# Patient Record
Sex: Male | Born: 1995 | Race: Black or African American | Hispanic: No | Marital: Single | Smoking: Never smoker
Health system: Southern US, Community
[De-identification: ages and names within clinical notes are randomized; demographics above are authoritative.]

## PROBLEM LIST (undated history)

## (undated) DIAGNOSIS — E119 Type 2 diabetes mellitus without complications: Secondary | ICD-10-CM

## (undated) DIAGNOSIS — J45909 Unspecified asthma, uncomplicated: Secondary | ICD-10-CM

## (undated) HISTORY — PX: ABDOMINAL SURGERY: SHX537

## (undated) HISTORY — PX: CENTRAL VENOUS CATHETER INSERTION: SHX401

---
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
## 2017-06-01 ENCOUNTER — Encounter (HOSPITAL_COMMUNITY): Payer: Self-pay | Admitting: Emergency Medicine

## 2017-06-01 ENCOUNTER — Emergency Department (HOSPITAL_COMMUNITY)
Admission: EM | Admit: 2017-06-01 | Discharge: 2017-06-02 | Disposition: A | Discharge status: Home / Self Care | Payer: Medicaid Other | Attending: Emergency Medicine | Admitting: Emergency Medicine

## 2017-06-01 DIAGNOSIS — R51 Headache: Secondary | ICD-10-CM | POA: Diagnosis not present

## 2017-06-01 DIAGNOSIS — R519 Headache, unspecified: Secondary | ICD-10-CM

## 2017-06-01 DIAGNOSIS — J45909 Unspecified asthma, uncomplicated: Secondary | ICD-10-CM | POA: Insufficient documentation

## 2017-06-01 MED ORDER — METOCLOPRAMIDE HCL 5 MG/ML IJ SOLN
10.0000 mg | INTRAMUSCULAR | Status: AC
  Administered 2017-06-01: 10 mg via INTRAVENOUS
  Filled 2017-06-01: qty 2

## 2017-06-01 MED ORDER — KETOROLAC TROMETHAMINE 30 MG/ML IJ SOLN
30.0000 mg | Freq: Once | INTRAMUSCULAR | Status: AC
  Administered 2017-06-01: 30 mg via INTRAVENOUS
  Filled 2017-06-01: qty 1

## 2017-06-01 MED ORDER — IBUPROFEN 200 MG PO TABS
400.0000 mg | ORAL_TABLET | Freq: Once | ORAL | Status: AC | PRN
  Administered 2017-06-01: 400 mg via ORAL
  Filled 2017-06-01: qty 2

## 2017-06-01 MED ORDER — DIPHENHYDRAMINE HCL 50 MG/ML IJ SOLN
25.0000 mg | Freq: Once | INTRAMUSCULAR | Status: AC
  Administered 2017-06-01: 25 mg via INTRAVENOUS
  Filled 2017-06-01: qty 1

## 2017-06-01 MED ORDER — ONDANSETRON 8 MG PO TBDP
8.0000 mg | ORAL_TABLET | Freq: Once | ORAL | Status: AC
  Administered 2017-06-01: 8 mg via ORAL
  Filled 2017-06-01: qty 1

## 2017-06-01 NOTE — ED Provider Notes (Signed)
[facility]-EMERGENCY DEPT Provider Note   CSN: [id number] Arrival date & time: 06/01/17  1955     History   Chief Complaint Chief Complaint  Patient presents with  . Migraine    HPI Henry Colon is a 21 y.o. male.  21 year old male with a history of asthma presents to the emergency department for a headache. He reports a constant pressure-like pain in his central forehead which began earlier today. Symptoms associated with photophobia as well as intermittent blurry vision in his left eye. He has also had some nausea and vomiting. Patient further complains of lightheadedness. No medications taken prior to arrival for symptoms. Mother reports a history of headaches intermittently. Patient states that his symptoms today feel similar. He has had no fever, extremity numbness, extremity weakness, syncope, diarrhea. No recent head injury or trauma.   The history is provided by the patient. No language interpreter was used.  Migraine     Past Medical History:  Diagnosis Date  . Asthma   . Premature baby     There are no active problems to display for this patient.   Past Surgical History:  Procedure Laterality Date  . ABDOMINAL SURGERY    . CENTRAL VENOUS CATHETER INSERTION         Home Medications    Prior to Admission medications   Medication Sig Start Date End Date Taking? Authorizing Provider  Multiple Vitamin (MULTIVITAMIN WITH MINERALS) TABS tablet Take 1 tablet by mouth daily.   Yes [provider]  albuterol (PROVENTIL HFA;VENTOLIN HFA) 108 (90 Base) MCG/ACT inhaler Inhale 2 puffs into the lungs every 4 (four) hours as needed for wheezing or shortness of breath. Patient not taking: Reported on 06/01/2017 01/09/16   Pisciotta, Joni Reining, PA-C  butalbital-acetaminophen-caffeine (FIORICET, ESGIC) [phone] MG tablet Take 1-2 tablets by mouth every 8 (eight) hours as needed for headache. 06/02/17 06/02/18  Antony Madura, PA-C  ibuprofen (ADVIL,MOTRIN) 600 MG tablet Take 1  tablet (600 mg total) by mouth every 6 (six) hours as needed for mild pain or moderate pain. Patient not taking: Reported on 06/01/2017 11/23/15   Antony Madura, PA-C    Family History History reviewed. No pertinent family history.  Social History Social History  Substance Use Topics  . Smoking status: Never Smoker  . Smokeless tobacco: Never Used  . Alcohol use No     Allergies   Patient has no known allergies.   Review of Systems Review of Systems Ten systems reviewed and are negative for acute change, except as noted in the HPI.    Physical Exam Updated Vital Signs BP (!) 98/58 (BP Location: Left Arm)   Pulse (!) 105   Temp 98.7 F (37.1 C) (Oral)   Resp 18   Ht 6' (1.829 m)   Wt 94.3 kg (208 lb)   SpO2 100%   BMI 28.21 kg/m   Physical Exam  Constitutional: He is oriented to person, place, and time. He appears well-developed and well-nourished. No distress.  Nontoxic and in NAD  HENT:  Head: Normocephalic and atraumatic.  Mouth/Throat: Oropharynx is clear and moist.  Eyes: Conjunctivae and EOM are normal. Pupils are equal, round, and reactive to light. No scleral icterus.  Neck: Normal range of motion.  No nuchal rigidity or meningismus  Cardiovascular: Normal rate, regular rhythm and intact distal pulses.   Pulmonary/Chest: Effort normal. No respiratory distress. He has no wheezes.  Respirations even and unlabored  Musculoskeletal: Normal range of motion.  Neurological: He is alert and oriented  to person, place, and time. No cranial nerve deficit. He exhibits normal muscle tone. Coordination normal.  GCS 15. Speech is goal oriented. No cranial nerve deficits appreciated; symmetric eyebrow raise, no facial drooping, tongue midline. Patient has equal grip strength bilaterally with 5/5 strength against resistance in all major muscle groups bilaterally. Sensation to light touch intact. Patient moves extremities without ataxia.  Skin: Skin is warm and dry. No rash  noted. He is not diaphoretic. No erythema. No pallor.  Psychiatric: He has a normal mood and affect. His behavior is normal.  Nursing note and vitals reviewed.    ED Treatments / Results  Labs (all labs ordered are listed, but only abnormal results are displayed) Labs Reviewed - No data to display  EKG  EKG Interpretation None       Radiology No results found.  Procedures Procedures (including critical care time)  Medications Ordered in ED Medications  ondansetron (ZOFRAN-ODT) disintegrating tablet 8 mg (8 mg Oral Given 06/01/17 2017)  ibuprofen (ADVIL,MOTRIN) tablet 400 mg (400 mg Oral Given 06/01/17 2020)  ketorolac (TORADOL) 30 MG/ML injection 30 mg (30 mg Intravenous Given 06/01/17 2310)  metoCLOPramide (REGLAN) injection 10 mg (10 mg Intravenous Given 06/01/17 2310)  diphenhydrAMINE (BENADRYL) injection 25 mg (25 mg Intravenous Given 06/01/17 2311)     Initial Impression / Assessment and Plan / ED Course  I have reviewed the triage vital signs and the nursing notes.  Pertinent labs & imaging results that were available during my care of the patient were reviewed by me and considered in my medical decision making (see chart for details).     21 year old male presents to the emergency department for evaluation of a frontal headache. He describes the pain as a pressure with associated photophobia, nausea, and vomiting. He reports a history of similar headaches in the past. No medications taken prior to arrival. No recent head injury or trauma.  Patient without fever, nuchal rigidity, or meningismus. Doubt meningitis. Further doubt hemorrhage, hydrocephalus, or mass effect given intermittent nature and chronicity of symptoms. Patient has had resolution of his pain with supportive management. Suspect tension headache. Will manage supportively on an outpatient basis. Patient expresses comfort with discharge. Return precautions discussed and provided. Patient discharged in stable  condition with no unaddressed concerns.   Final Clinical Impressions(s) / ED Diagnoses   Final diagnoses:  Bad headache    New Prescriptions New Prescriptions   BUTALBITAL-ACETAMINOPHEN-CAFFEINE (FIORICET, ESGIC) 50-325-40 MG TABLET    Take 1-2 tablets by mouth every 8 (eight) hours as needed for headache.     Antony MaduraHumes, Bright Spielmann, PA-C 06/02/17 0041    Pricilla LovelessGoldston, Scott, MD 06/07/17 [phone]

## 2017-06-01 NOTE — ED Notes (Signed)
Pt is alert and oriented x 4 and is verbally responsive, and is accompanied with mother and brothers. Pt is / c/o Migraine, reports pain is 4/10 with ome associated vomiting. Pt does report some lightheaded. No nausea at this time, and denies blurred vision, diarrhea, or pain or discomfort other parts of body.

## 2017-06-01 NOTE — ED Triage Notes (Signed)
[place] EMS was called due to patient complaining of headache. Patient vomited as he entered the ED.

## 2017-06-02 MED ORDER — BUTALBITAL-APAP-CAFFEINE 50-325-40 MG PO TABS: 1.0000 | ORAL_TABLET | Freq: Three times a day (TID) | ORAL | 0 refills | Status: AC | PRN

## 2017-09-23 ENCOUNTER — Encounter (HOSPITAL_COMMUNITY): Payer: Self-pay | Admitting: *Deleted

## 2017-09-23 ENCOUNTER — Emergency Department (HOSPITAL_COMMUNITY)
Admission: EM | Admit: 2017-09-23 | Discharge: 2017-09-23 | Discharge status: Against Medical Advice | Payer: Medicaid Other | Attending: Emergency Medicine | Admitting: Emergency Medicine

## 2017-09-23 DIAGNOSIS — R51 Headache: Secondary | ICD-10-CM | POA: Diagnosis present

## 2017-09-23 DIAGNOSIS — Z5321 Procedure and treatment not carried out due to patient leaving prior to being seen by health care provider: Secondary | ICD-10-CM | POA: Insufficient documentation

## 2017-09-23 NOTE — ED Notes (Addendum)
Pt called from lobby with no response. 

## 2017-09-23 NOTE — ED Notes (Addendum)
Pt called from the lobby with no response x2 

## 2017-09-23 NOTE — ED Triage Notes (Signed)
Started to feel light headed and developed headache last night, has not ate today but has drank some water.

## 2018-03-31 ENCOUNTER — Encounter (HOSPITAL_COMMUNITY): Payer: Self-pay | Admitting: Emergency Medicine

## 2018-03-31 ENCOUNTER — Emergency Department (HOSPITAL_COMMUNITY)
Admission: EM | Admit: 2018-03-31 | Discharge: 2018-03-31 | Disposition: A | Discharge status: Home / Self Care | Payer: Self-pay | Attending: Emergency Medicine | Admitting: Emergency Medicine

## 2018-03-31 ENCOUNTER — Emergency Department (HOSPITAL_COMMUNITY): Payer: Self-pay

## 2018-03-31 DIAGNOSIS — J069 Acute upper respiratory infection, unspecified: Secondary | ICD-10-CM | POA: Insufficient documentation

## 2018-03-31 DIAGNOSIS — Z79899 Other long term (current) drug therapy: Secondary | ICD-10-CM | POA: Insufficient documentation

## 2018-03-31 DIAGNOSIS — R05 Cough: Secondary | ICD-10-CM | POA: Insufficient documentation

## 2018-03-31 DIAGNOSIS — R059 Cough, unspecified: Secondary | ICD-10-CM

## 2018-03-31 DIAGNOSIS — J45909 Unspecified asthma, uncomplicated: Secondary | ICD-10-CM | POA: Insufficient documentation

## 2018-03-31 NOTE — ED Triage Notes (Signed)
Patient here from home with complaints of productive cough with yellow sputum x2 days. Denies fever.

## 2018-03-31 NOTE — Discharge Instructions (Signed)
Continue to stay well-hydrated. Gargle warm salt water and spit it out and use chloraseptic spray as needed for sore throat. Continue to alternate between Tylenol and Ibuprofen for pain or fever. Use Mucinex for cough suppression/expectoration of mucus. Use netipot and flonase to help with nasal congestion. May consider over-the-counter Benadryl or other antihistamine to decrease secretions and for help with your symptoms. Follow up with your primary care doctor in 5-7 days for recheck of ongoing symptoms. Return to emergency department for emergent changing or worsening of symptoms.

## 2018-03-31 NOTE — ED Provider Notes (Signed)
[facility]-EMERGENCY DEPT Provider Note   CSN: [id number] Arrival date & time: 03/31/18  1333     History   Chief Complaint Chief Complaint  Patient presents with  . Cough    HPI Henry Colon is a 22 y.o. male with a PMHx of asthma, who presents to the ED with complaints of cough with yellow sputum production and hoarse voice with rhinorrhea x 2 days.  He has tried over-the-counter cold medicines with no relief of his symptoms, no known aggravating factors.  He is a non-smoker, no known sick contacts.  He denies any fevers, chills, ear pain or drainage, sore throat, chest pain, shortness of breath, wheezing, abd pain, N/V/D/C, hematuria, dysuria, myalgias, arthralgias, numbness, tingling, focal weakness, or any other complaints at this time.   The history is provided by the patient and medical records. No language interpreter was used.  Cough  This is a new problem. The current episode started 2 days ago. The problem occurs constantly. The problem has not changed since onset.The cough is productive of sputum. There has been no fever. Associated symptoms include rhinorrhea. Pertinent negatives include no chest pain, no chills, no ear pain, no sore throat, no myalgias, no shortness of breath and no wheezing. He has tried cough syrup for the symptoms. The treatment provided no relief. He is not a smoker. His past medical history is significant for asthma.    Past Medical History:  Diagnosis Date  . Asthma   . Premature baby     There are no active problems to display for this patient.   Past Surgical History:  Procedure Laterality Date  . ABDOMINAL SURGERY    . CENTRAL VENOUS CATHETER INSERTION          Home Medications    Prior to Admission medications   Medication Sig Start Date End Date Taking? Authorizing Provider  albuterol (PROVENTIL HFA;VENTOLIN HFA) 108 (90 Base) MCG/ACT inhaler Inhale 2 puffs into the lungs every 4 (four) hours as needed  for wheezing or shortness of breath. Patient not taking: Reported on 06/01/2017 01/09/16   Pisciotta, Joni Reining, PA-C  butalbital-acetaminophen-caffeine (FIORICET, ESGIC) [phone] MG tablet Take 1-2 tablets by mouth every 8 (eight) hours as needed for headache. 06/02/17 06/02/18  Antony Madura, PA-C  ibuprofen (ADVIL,MOTRIN) 600 MG tablet Take 1 tablet (600 mg total) by mouth every 6 (six) hours as needed for mild pain or moderate pain. Patient not taking: Reported on 06/01/2017 11/23/15   Antony Madura, PA-C  Multiple Vitamin (MULTIVITAMIN WITH MINERALS) TABS tablet Take 1 tablet by mouth daily.    [provider]    Family History No family history on file.  Social History Social History   Tobacco Use  . Smoking status: Never Smoker  . Smokeless tobacco: Never Used  Substance Use Topics  . Alcohol use: No  . Drug use: No     Allergies   Patient has no known allergies.   Review of Systems Review of Systems  Constitutional: Negative for chills and fever.  HENT: Positive for rhinorrhea and voice change. Negative for ear discharge, ear pain and sore throat.   Respiratory: Positive for cough. Negative for shortness of breath and wheezing.   Cardiovascular: Negative for chest pain.  Gastrointestinal: Negative for abdominal pain, constipation, diarrhea, nausea and vomiting.  Genitourinary: Negative for dysuria and hematuria.  Musculoskeletal: Negative for arthralgias and myalgias.  Skin: Negative for color change.  Allergic/Immunologic: Negative for immunocompromised state.  Neurological: Negative for weakness and  numbness.  Psychiatric/Behavioral: Negative for confusion.   All other systems reviewed and are negative for acute change except as noted in the HPI.    Physical Exam Updated Vital Signs BP 137/77 (BP Location: Right Arm)   Pulse 80   Temp 98.5 F (36.9 C) (Oral)   Resp 18   SpO2 99%   Physical Exam  Constitutional: He is oriented to person, place, and time.  Vital signs are normal. He appears well-developed and well-nourished.  Non-toxic appearance. No distress.  Afebrile, nontoxic, NAD  HENT:  Head: Normocephalic and atraumatic.  Nose: Mucosal edema present.  Mouth/Throat: Uvula is midline, oropharynx is clear and moist and mucous membranes are normal. No trismus in the jaw. No uvula swelling. Tonsils are 0 on the right. Tonsils are 0 on the left. No tonsillar exudate.  Nose mildly congested. Oropharynx clear and moist, without uvular swelling or deviation, no trismus or drooling, no tonsillar swelling or erythema, no exudates.  Eyes: Conjunctivae and EOM are normal. Right eye exhibits no discharge. Left eye exhibits no discharge.  Neck: Normal range of motion. Neck supple.  Cardiovascular: Normal rate, regular rhythm, normal heart sounds and intact distal pulses. Exam reveals no gallop and no friction rub.  No murmur heard. Pulmonary/Chest: Effort normal and breath sounds normal. No respiratory distress. He has no decreased breath sounds. He has no wheezes. He has no rhonchi. He has no rales.  CTAB in all lung fields, no w/r/r, no hypoxia or increased WOB, speaking in full sentences, SpO2 99% on RA   Abdominal: Soft. Normal appearance and bowel sounds are normal. He exhibits no distension. There is no tenderness. There is no rigidity, no rebound, no guarding, no CVA tenderness, no tenderness at McBurney's point and negative Murphy's sign.  Musculoskeletal: Normal range of motion.  Neurological: He is alert and oriented to person, place, and time. He has normal strength. No sensory deficit.  Skin: Skin is warm, dry and intact. No rash noted.  Psychiatric: He has a normal mood and affect.  Nursing note and vitals reviewed.    ED Treatments / Results  Labs (all labs ordered are listed, but only abnormal results are displayed) Labs Reviewed - No data to display  EKG None  Radiology Dg Chest 2 View  Result Date: 03/31/2018 CLINICAL DATA:   Productive cough for 2 days. EXAM: CHEST - 2 VIEW COMPARISON:  01/09/2016 FINDINGS: The heart size and mediastinal contours are within normal limits. Both lungs are clear. No pleural effusion or pneumothorax. The visualized skeletal structures are unremarkable. IMPRESSION: Normal chest radiographs. Electronically Signed   By: Amie Portlandavid  Ormond M.D.   On: 03/31/2018 14:31    Procedures Procedures (including critical care time)  Medications Ordered in ED Medications - No data to display   Initial Impression / Assessment and Plan / ED Course  I have reviewed the triage vital signs and the nursing notes.  Pertinent labs & imaging results that were available during my care of the patient were reviewed by me and considered in my medical decision making (see chart for details).     22 y.o. male here with cough and voice hoarseness x2 days. On exam, clear lung exam, throat clear, nose mildly congested. Will get CXR to eval for bronchitis or PNA. Will reassess shortly.   2:51 PM CXR negative. Likely viral URI illness. Advised OTC remedies for symptomatic care. F/up with PCP in 1wk for recheck. I explained the diagnosis and have given explicit precautions to return to  the ER including for any other new or worsening symptoms. The patient understands and accepts the medical plan as it's been dictated and I have answered their questions. Discharge instructions concerning home care and prescriptions have been given. The patient is STABLE and is discharged to home in good condition.     Final Clinical Impressions(s) / ED Diagnoses   Final diagnoses:  Cough  Upper respiratory tract infection, unspecified type    ED Discharge Orders    [address], [place], [place] 03/31/18 1451    Gwyneth Sprout, MD 03/31/18 [phone]

## 2018-04-02 ENCOUNTER — Encounter (HOSPITAL_COMMUNITY): Payer: Self-pay | Admitting: Emergency Medicine

## 2018-04-02 ENCOUNTER — Other Ambulatory Visit: Payer: Self-pay

## 2018-04-02 ENCOUNTER — Emergency Department (HOSPITAL_COMMUNITY)
Admission: EM | Admit: 2018-04-02 | Discharge: 2018-04-02 | Disposition: A | Discharge status: Home / Self Care | Payer: Self-pay | Attending: Emergency Medicine | Admitting: Emergency Medicine

## 2018-04-02 DIAGNOSIS — J45909 Unspecified asthma, uncomplicated: Secondary | ICD-10-CM | POA: Insufficient documentation

## 2018-04-02 DIAGNOSIS — Z79899 Other long term (current) drug therapy: Secondary | ICD-10-CM | POA: Insufficient documentation

## 2018-04-02 DIAGNOSIS — R059 Cough, unspecified: Secondary | ICD-10-CM

## 2018-04-02 DIAGNOSIS — R05 Cough: Secondary | ICD-10-CM | POA: Insufficient documentation

## 2018-04-02 MED ORDER — ALBUTEROL SULFATE HFA 108 (90 BASE) MCG/ACT IN AERS: 1.0000 | INHALATION_SPRAY | Freq: Four times a day (QID) | RESPIRATORY_TRACT | 0 refills | Status: DC | PRN

## 2018-04-02 MED ORDER — IPRATROPIUM-ALBUTEROL 0.5-2.5 (3) MG/3ML IN SOLN
3.0000 mL | Freq: Once | RESPIRATORY_TRACT | Status: AC
  Administered 2018-04-02: 3 mL via RESPIRATORY_TRACT
  Filled 2018-04-02: qty 3

## 2018-04-02 NOTE — ED Provider Notes (Signed)
[facility]   CSN: [id number] Arrival date & time: 04/02/18  [id number]     History   Chief Complaint Chief Complaint  Patient presents with  . Cough    HPI Henry Colon is a 22 y.o. male with h/o asthma here for evaluation of continued dry cough, worse at night and breathing in x 2 days. Associated with nasal congestion and runny nose. Initially had mild headache and voice hoarseness which have resolved. [facility] and was told to stay hydrated and rest. Has tried over the counter cough medicines without relief. No modifying factors. No tobacco abuse. No sick contacts. Denies fevers, chills, sore throat, nausea, vomiting, abdominal pain, changes in BMs.   HPI  Past Medical History:  Diagnosis Date  . Asthma   . Premature baby     There are no active problems to display for this patient.   Past Surgical History:  Procedure Laterality Date  . ABDOMINAL SURGERY    . CENTRAL VENOUS CATHETER INSERTION          Home Medications    Prior to Admission medications   Medication Sig Start Date End Date Taking? Authorizing Provider  albuterol (PROVENTIL HFA;VENTOLIN HFA) 108 (90 Base) MCG/ACT inhaler Inhale 1-2 puffs into the lungs every 6 (six) hours as needed for wheezing or shortness of breath. 04/02/18   Liberty HandyGibbons, Claudia J, PA-C  butalbital-acetaminophen-caffeine (FIORICET, ESGIC) [phone] MG tablet Take 1-2 tablets by mouth every 8 (eight) hours as needed for headache. 06/02/17 06/02/18  Antony MaduraHumes, Kelly, PA-C  ibuprofen (ADVIL,MOTRIN) 600 MG tablet Take 1 tablet (600 mg total) by mouth every 6 (six) hours as needed for mild pain or moderate pain. Patient not taking: Reported on 06/01/2017 11/23/15   Antony MaduraHumes, Kelly, PA-C  Multiple Vitamin (MULTIVITAMIN WITH MINERALS) TABS tablet Take 1 tablet by mouth daily.    [provider]    Family History No family history on file.  Social History Social  History   Tobacco Use  . Smoking status: Never Smoker  . Smokeless tobacco: Never Used  Substance Use Topics  . Alcohol use: No  . Drug use: No     Allergies   Patient has no known allergies.   Review of Systems Review of Systems  HENT: Positive for congestion, rhinorrhea and voice change (resolved).   Respiratory: Positive for cough.   Neurological: Positive for headaches (resolved).  All other systems reviewed and are negative.    Physical Exam Updated Vital Signs BP 117/80   Pulse 83   Temp 98.7 F (37.1 C) (Oral)   Resp 16   Wt 93 kg (205 lb)   SpO2 96%   BMI 27.80 kg/m   Physical Exam  Constitutional: He is oriented to person, place, and time. He appears well-developed and well-nourished. No distress.  NAD.  HENT:  Head: Normocephalic and atraumatic.  Right Ear: External ear normal.  Left Ear: External ear normal.  Nose: Nose normal.  Mild mucosal edema, clear rhinorrhea bilaterally. Oropharynx and tonsils normal. Normal phonation.  Eyes: Conjunctivae and EOM are normal. No scleral icterus.  Neck: Normal range of motion. Neck supple.  Cardiovascular: Normal rate, regular rhythm, normal heart sounds and intact distal pulses.  No murmur heard. Pulmonary/Chest: Effort normal and breath sounds normal. He has no wheezes.  Musculoskeletal: Normal range of motion. He exhibits no deformity.  Neurological: He is alert and oriented to person, place, and time.  Skin: Skin is warm and dry. Capillary refill takes less than 2 seconds.  Psychiatric: He has a normal mood and affect. His behavior is normal. Judgment and thought content normal.  Nursing note and vitals reviewed.    ED Treatments / Results  Labs (all labs ordered are listed, but only abnormal results are displayed) Labs Reviewed - No data to display  EKG None  Radiology Dg Chest 2 View  Result Date: 03/31/2018 CLINICAL DATA:  Productive cough for 2 days. EXAM: CHEST - 2 VIEW COMPARISON:   01/09/2016 FINDINGS: The heart size and mediastinal contours are within normal limits. Both lungs are clear. No pleural effusion or pneumothorax. The visualized skeletal structures are unremarkable. IMPRESSION: Normal chest radiographs. Electronically Signed   By: Amie Portland M.D.   On: 03/31/2018 14:31    Procedures Procedures (including critical care time)  Medications Ordered in ED Medications  ipratropium-albuterol (DUONEB) 0.5-2.5 (3) MG/3ML nebulizer solution 3 mL (3 mLs Nebulization Given 04/02/18 1004)     Initial Impression / Assessment and Plan / ED Course  I have reviewed the triage vital signs and the nursing notes.  Pertinent labs & imaging results that were available during my care of the patient were reviewed by me and considered in my medical decision making (see chart for details).    22 y.o. -year-old male with ppmh of asthma presents with URI like symptoms   days. On my exam patient is nontoxic appearing, speaking in full sentences, w/o increased WOB. No fever, tachypnea, tachycardia, hypoxia. Lungs are CTAB. I do not think that a CXR is indicated at this time as VS are WNL, there are no signs of consolidation on -auscultation and there is no hypoxia. No significant h/o immunocompromise. Doubt bacterial pneumonia.  Will give duoneb.   Given reassuring physical exam, will discharge with symptomatic treatment. Likely allergic rhinitis/cough vs viral URI given spring season and underlying h/o asthma. DC with albuterol, anti-histamines, nasal rineses. Strict ED return precautions given.   Final Clinical Impressions(s) / ED Diagnoses   Final diagnoses:  Cough    ED Discharge Orders        Ordered    albuterol (PROVENTIL HFA;VENTOLIN HFA) 108 (90 Base) MCG/ACT inhaler  Every 6 hours PRN     04/02/18 1037       Liberty Handy, PA-C 04/02/18 1038    Melene Plan, DO 04/03/18 0700

## 2018-04-02 NOTE — Discharge Instructions (Signed)
I suspect your runny nose, dry cough may be from seasonal allergies possibly also from your mild asthma. Use your albuterol inhaler every 6 hours for the next 2 days to help with cough. Take a daily allergy medication. Over-the-counter nasal spray such as Flonase can help with nasal congestion, runny nose, postnasal drip that can be causing  cough to worsen. Follow up with a primary care doctor in 1 week if symptoms persist. Return to the ED if you develop fevers, chills, chest pain, shortness of breath,

## 2018-04-02 NOTE — ED Triage Notes (Signed)
PT reports "breathy" cough that is non productive for 2 days. PT also reports chest tightness and headaches for same amount of time.  PT denies sore throat, congestion, and fever

## 2022-01-15 ENCOUNTER — Emergency Department (HOSPITAL_COMMUNITY): Payer: Self-pay

## 2022-01-15 ENCOUNTER — Encounter (HOSPITAL_COMMUNITY): Payer: Self-pay

## 2022-01-15 ENCOUNTER — Other Ambulatory Visit: Payer: Self-pay

## 2022-01-15 ENCOUNTER — Emergency Department (HOSPITAL_COMMUNITY)
Admission: EM | Admit: 2022-01-15 | Discharge: 2022-01-15 | Disposition: A | Discharge status: Home / Self Care | Payer: Self-pay | Attending: Emergency Medicine | Admitting: Emergency Medicine

## 2022-01-15 DIAGNOSIS — R739 Hyperglycemia, unspecified: Secondary | ICD-10-CM | POA: Insufficient documentation

## 2022-01-15 DIAGNOSIS — Z20822 Contact with and (suspected) exposure to covid-19: Secondary | ICD-10-CM | POA: Insufficient documentation

## 2022-01-15 DIAGNOSIS — J988 Other specified respiratory disorders: Secondary | ICD-10-CM | POA: Insufficient documentation

## 2022-01-15 LAB — URINALYSIS, ROUTINE W REFLEX MICROSCOPIC
Bacteria, UA: NONE SEEN
Bilirubin Urine: NEGATIVE
Glucose, UA: >=500 mg/dL — AB
Hgb urine dipstick: NEGATIVE
Ketones, ur: 20 mg/dL — AB
Leukocytes,Ua: NEGATIVE
Nitrite: NEGATIVE
Protein, ur: NEGATIVE mg/dL
Specific Gravity, Urine: 1.035 — ABNORMAL HIGH (ref 1.005–1.030)
pH: 7 (ref 5.0–8.0)

## 2022-01-15 LAB — BASIC METABOLIC PANEL
Anion gap: 10 (ref 5–15)
BUN: 9 mg/dL (ref 6–20)
CO2: 27 mmol/L (ref 22–32)
Calcium: 8.8 mg/dL — ABNORMAL LOW (ref 8.9–10.3)
Chloride: 95 mmol/L — ABNORMAL LOW (ref 98–111)
Creatinine, Ser: 1.08 mg/dL (ref 0.61–1.24)
GFR, Estimated: >60 mL/min (ref >60)
Glucose, Bld: 293 mg/dL — ABNORMAL HIGH (ref 70–99)
Potassium: 3.6 mmol/L (ref 3.5–5.1)
Sodium: 132 mmol/L — ABNORMAL LOW (ref 135–145)

## 2022-01-15 LAB — CBC
HCT: 46.3 % (ref 39.0–52.0)
Hemoglobin: 14.9 g/dL (ref 13.0–17.0)
MCH: 25.9 pg — ABNORMAL LOW (ref 26.0–34.0)
MCHC: 32.2 g/dL (ref 30.0–36.0)
MCV: 80.4 fL (ref 80.0–100.0)
Platelets: 247 10*3/uL (ref 150–400)
RBC: 5.76 MIL/uL (ref 4.22–5.81)
RDW: 12.3 % (ref 11.5–15.5)
WBC: 8.7 10*3/uL (ref 4.0–10.5)
nRBC: 0 % (ref 0.0–0.2)

## 2022-01-15 LAB — RESP PANEL BY RT-PCR (FLU A&B, COVID) ARPGX2
Influenza A by PCR: NEGATIVE
Influenza B by PCR: NEGATIVE
SARS Coronavirus 2 by RT PCR: NEGATIVE

## 2022-01-15 LAB — GROUP A STREP BY PCR: Group A Strep by PCR: NOT DETECTED

## 2022-01-15 LAB — CBG MONITORING, ED: Glucose-Capillary: 243 mg/dL — ABNORMAL HIGH (ref 70–99)

## 2022-01-15 LAB — TROPONIN I (HIGH SENSITIVITY): Troponin I (High Sensitivity): 3 ng/L (ref <18)

## 2022-01-15 MED ORDER — DOXYCYCLINE HYCLATE 100 MG PO CAPS: 100.0000 mg | ORAL_CAPSULE | Freq: Two times a day (BID) | ORAL | 0 refills | Status: DC

## 2022-01-15 MED ORDER — ALBUTEROL SULFATE HFA 108 (90 BASE) MCG/ACT IN AERS: 1.0000 | INHALATION_SPRAY | Freq: Four times a day (QID) | RESPIRATORY_TRACT | 1 refills | Status: DC | PRN

## 2022-01-15 MED ORDER — IPRATROPIUM-ALBUTEROL 0.5-2.5 (3) MG/3ML IN SOLN
3.0000 mL | Freq: Once | RESPIRATORY_TRACT | Status: AC
  Administered 2022-01-15: 3 mL via RESPIRATORY_TRACT
  Filled 2022-01-15: qty 3

## 2022-01-15 MED ORDER — METFORMIN HCL 500 MG PO TABS
500.0000 mg | ORAL_TABLET | Freq: Two times a day (BID) | ORAL | 1 refills | Status: AC
Start: 2022-01-15 — End: ?

## 2022-01-15 NOTE — Discharge Instructions (Addendum)
You were seen here today for evaluation of your cough and cold symptoms. You lab work was normal, but your xray did show a viral or atypical pneumonia. I will prescribe you doxycycline and albuterol for your cough. Doxycycline is an antibiotic to take daily for the next 7 days. Additionally, you have diabetes. You need to take control of this. If you do not, you can cause serious damage to your organs, arms, legs, etc. You need to follow up with your PCP for better control and evaluation of your diabetes and your asthma. If you have any concern, or new or worsening symptoms, please return to the nearest ER for evaluation.

## 2022-01-15 NOTE — ED Triage Notes (Signed)
Pt reports SHOB, chest pain, cough, and chills x2 days. Denies abdominal pain and N/V/D. Denies hx of asthma or respiratory disorders.

## 2022-01-15 NOTE — ED Provider Notes (Signed)
[facility]-EMERGENCY DEPT Provider Note   CSN: [id number] Arrival date & time: 01/15/22  6384     History Chief Complaint  Patient presents with   Shortness of Breath   Chest Pain    Henry Colon is a 26 y.o. male with h/o asthma presents to the ED for evaluation of dull intermittent chest pain with shortness of breath for the past 2 days.  Patient reports the chest pain is sternal and is present without a cough.  He additionally mentions a sore throat with coughing.  Denies any headache, body aches, fever, lightheadedness, dizziness, runny nose, or nasal congestion.  He reports he is taken Alka-Seltzer, Tylenol, and Advil cold and flu without relief.  Surgical history includes an open laparoscopic he as an infant.  He denies any daily medications.  Denies any inhaler use as he reports he does not follow-up with a PCP.  No known drug allergies.  Denies any tobacco, vaping, EtOH, or illicit drug use ever.          Shortness of Breath Associated symptoms: chest pain, cough and sore throat   Associated symptoms: no abdominal pain, no fever and no vomiting   Chest Pain Associated symptoms: cough and shortness of breath   Associated symptoms: no abdominal pain, no dizziness, no fever, no nausea, no vomiting and no weakness       Home Medications Prior to Admission medications   Medication Sig Start Date End Date Taking? Authorizing Provider  albuterol (PROVENTIL HFA;VENTOLIN HFA) 108 (90 Base) MCG/ACT inhaler Inhale 1-2 puffs into the lungs every 6 (six) hours as needed for wheezing or shortness of breath. 04/02/18   Liberty Handy, PA-C  ibuprofen (ADVIL,MOTRIN) 600 MG tablet Take 1 tablet (600 mg total) by mouth every 6 (six) hours as needed for mild pain or moderate pain. Patient not taking: Reported on 06/01/2017 11/23/15   Antony Madura, PA-C  Multiple Vitamin (MULTIVITAMIN WITH MINERALS) TABS tablet Take 1 tablet by mouth daily.    [provider]       Allergies    Patient has no known allergies.    Review of Systems   Review of Systems  Constitutional:  Negative for chills and fever.  HENT:  Positive for sore throat. Negative for congestion and rhinorrhea.   Respiratory:  Positive for cough and shortness of breath.   Cardiovascular:  Positive for chest pain.  Gastrointestinal:  Negative for abdominal pain, diarrhea, nausea and vomiting.  Neurological:  Negative for dizziness, weakness and light-headedness.   Physical Exam Updated Vital Signs BP 130/81    Pulse 93    Temp 97.9 F (36.6 C) (Oral)    Resp (!) 22    Ht 6\' 3"  (1.905 m)    Wt 98.9 kg    SpO2 100%    BMI 27.25 kg/m  Physical Exam Vitals and nursing note reviewed.  Constitutional:      General: He is not in acute distress.    Appearance: Normal appearance. He is not ill-appearing or toxic-appearing.  HENT:     Head: Normocephalic and atraumatic.     Mouth/Throat:     Mouth: Mucous membranes are moist.     Pharynx: No pharyngeal swelling or oropharyngeal exudate.  Eyes:     General: No scleral icterus. Cardiovascular:     Rate and Rhythm: Normal rate and regular rhythm.  Pulmonary:     Effort: Pulmonary effort is normal. No accessory muscle usage or respiratory distress.  Breath sounds: Normal breath sounds.     Comments: Expiratory wheezing heard throughout.  No rhonchi auscultated.  No respiratory distress, accessory muscle use, tripoding, nasal flaring, or cyanosis present.  Patient satting 99% on room air.  Patient speaking full sentences with ease. Chest:     Chest wall: No deformity, tenderness or crepitus.  Abdominal:     General: Abdomen is flat. Bowel sounds are normal.     Palpations: Abdomen is soft.     Tenderness: There is no abdominal tenderness. There is no guarding or rebound.  Musculoskeletal:        General: No deformity.     Cervical back: Normal range of motion.     Right lower leg: No edema.     Left lower leg: No edema.   Skin:    General: Skin is warm and dry.  Neurological:     General: No focal deficit present.     Mental Status: He is alert. Mental status is at baseline.    ED Results / Procedures / Treatments   Labs (all labs ordered are listed, but only abnormal results are displayed) Labs Reviewed  BASIC METABOLIC PANEL - Abnormal; Notable for the following components:      Result Value   Sodium 132 (*)    Chloride 95 (*)    Glucose, Bld 293 (*)    Calcium 8.8 (*)    All other components within normal limits  CBC - Abnormal; Notable for the following components:   MCH 25.9 (*)    All other components within normal limits  URINALYSIS, ROUTINE W REFLEX MICROSCOPIC - Abnormal; Notable for the following components:   Specific Gravity, Urine 1.035 (*)    Glucose, UA >=500 (*)    Ketones, ur 20 (*)    All other components within normal limits  CBG MONITORING, ED - Abnormal; Notable for the following components:   Glucose-Capillary 243 (*)    All other components within normal limits  RESP PANEL BY RT-PCR (FLU A&B, COVID) [id number]  GROUP A STREP BY PCR  HEMOGLOBIN A1C  TROPONIN I (HIGH SENSITIVITY)    EKG EKG Interpretation  Date/Time:  Saturday January 15 2022 08:44:05 EST Ventricular Rate:  94 PR Interval:  153 QRS Duration: 78 QT Interval:  322 QTC Calculation: 403 R Axis:   74 Text Interpretation: Sinus rhythm Borderline T wave abnormalities Confirmed by Godfrey Pick ([id number]) on 01/15/2022 9:55:13 AM  Radiology DG Chest 2 View  Result Date: 01/15/2022 CLINICAL DATA:  26 year old male with shortness of breath, central chest pain, cough and chills. EXAM: CHEST - 2 VIEW COMPARISON:  Chest radiographs 03/31/2018 and earlier. FINDINGS: Lung volumes and mediastinal contours remain normal. Visualized tracheal air column is within normal limits. No pneumothorax, pleural effusion or consolidation. But there is streaky bilateral perihilar and interstitial opacity when compared to 2019, including  at the anterior basilar lower lobe segments. No acute osseous abnormality identified. Negative visible bowel gas. IMPRESSION: Streaky bilateral perihilar and interstitial opacity, nonspecific but suspicious for bilateral acute viral/atypical respiratory infection. No pleural effusion. Electronically Signed   By: Genevie Ann M.D.   On: 01/15/2022 09:21    Procedures Procedures   Medications Ordered in ED Medications  ipratropium-albuterol (DUONEB) 0.5-2.5 (3) MG/3ML nebulizer solution 3 mL (3 mLs Nebulization Given 01/15/22 1205)    ED Course/ Medical Decision Making/ A&P  Medical Decision Making Amount and/or Complexity of Data Reviewed Labs: ordered. Radiology: ordered.  Risk Prescription drug management.   26 year old male presents the emergency department for evaluation of shortness of breath and intermittent dull substernal chest pain for the past 2 days.  Differential diagnosis includes but is not limited to asthma exacerbation, ACS, viral illness, pneumonia, pneumothorax, pericarditis.  Vital signs are stable.  Patient normotensive, afebrile, normal pulse rate, satting 97% on room air.  Physical exam remarkable for some expiratory wheezing heard in bilateral bases.  Chest nontender to palpation.  Unable to reproduce chest pain.  Moist mucous membranes.  No pharyngeal erythema, edema, or exudate noted.  Labs and imaging ordered in triage.  Records obtained from previous chart review, patient, and patient's mom.  CBC shows no leukocytosis or anemia.  BMP shows mildly decreased sodium however the patient's glucose is 293.  Likely pseudohyponatremia.  Mild hypocalcemia.  Troponin of 3.  Strep not detected.  Negative for COVID and flu.  Urinalysis shows concentrated urine with greater than 500 glucose and 20 ketones.  No acidosis noted on patient's BMP though. Low suspicion for DKA at this time. I agree with the radiologist interpretation of the chest x-ray Streaky  bilateral perihilar and interstitial opacity, nonspecific but suspicious for bilateral acute viral/atypical respiratory infection. No pleural effusion.  Given the patient's elevated glucose with no history of diabetes, I looked at previous charts and noticed a visit from 12-30-2020 from an outside hospital Where he also presented with chest pain.  The patient was found end of glucose of 290 with a A1c of 11.4.  The previous note mentions that he denies any personal family history of diabetes, although mom is in the room stating that she has diabetes as well.  He was discharged on 1000 mg of metformin twice daily and arrange follow-up with medicine team.  I went back into the room and questioned the patient further on his diagnosis of diabetes.  The patient reports that he "does not feel like he has diabetes".  Patient has not been taking any metformin and has not followed up with PCP since.  At this time, I had a long conversation with the patient on the sequelae of diabetes especially when uncontrolled.  Advised him to follow with PCP immediately and I would restart, metformin, although I will start him on a lower dose in hopes of compliancy.   Hear score of 2. Given the reassuring EKG, labs, and imaging, the the patient is safe for discharge.  This is likely an asthma exacerbation, however given the x-ray findings we will put patient on doxycycline for atypical coverage.  I prescribed patient an albuterol inhaler, metformin, and doxycycline.  Again, I heavily stressed the importance of following with PCP for management of his diabetes and asthma.  Strict return precautions given.  Patient agrees to plan.  Patient is stable and being discharged home in good condition.  Final Clinical Impression(s) / ED Diagnoses Final diagnoses:  Hyperglycemia  Respiratory infection    Rx / DC Orders ED Discharge Orders          Ordered    albuterol (VENTOLIN HFA) 108 (90 Base) MCG/ACT inhaler  Every 6 hours PRN         01/15/22 1531    metFORMIN (GLUCOPHAGE) 500 MG tablet  2 times daily with meals        01/15/22 1531    doxycycline (VIBRAMYCIN) 100 MG capsule  2 times daily  01/15/22 1547              Sherrell Puller, PA-C 01/16/22 2206    Godfrey Pick, MD 01/17/22 1052

## 2022-01-17 LAB — HEMOGLOBIN A1C
Hgb A1c MFr Bld: 12.2 % — ABNORMAL HIGH (ref 4.8–5.6)
Mean Plasma Glucose: 303 mg/dL

## 2022-10-06 ENCOUNTER — Other Ambulatory Visit: Payer: Self-pay

## 2022-10-06 ENCOUNTER — Encounter (HOSPITAL_COMMUNITY): Payer: Self-pay | Admitting: Emergency Medicine

## 2022-10-06 ENCOUNTER — Emergency Department (HOSPITAL_COMMUNITY)
Admission: EM | Admit: 2022-10-06 | Discharge: 2022-10-06 | Disposition: A | Discharge status: Home / Self Care | Payer: Self-pay | Attending: Emergency Medicine | Admitting: Emergency Medicine

## 2022-10-06 DIAGNOSIS — K0889 Other specified disorders of teeth and supporting structures: Secondary | ICD-10-CM | POA: Insufficient documentation

## 2022-10-06 HISTORY — DX: Type 2 diabetes mellitus without complications: E11.9

## 2022-10-06 MED ORDER — KETOROLAC TROMETHAMINE 30 MG/ML IJ SOLN
30.0000 mg | Freq: Once | INTRAMUSCULAR | Status: AC
  Administered 2022-10-06: 30 mg via INTRAMUSCULAR
  Filled 2022-10-06: qty 1

## 2022-10-06 MED ORDER — PENICILLIN V POTASSIUM 500 MG PO TABS: 500.0000 mg | ORAL_TABLET | Freq: Four times a day (QID) | ORAL | 0 refills | Status: AC

## 2022-10-06 MED ORDER — PENICILLIN V POTASSIUM 500 MG PO TABS
500.0000 mg | ORAL_TABLET | Freq: Once | ORAL | Status: AC
Start: 2022-10-06 — End: 2022-10-06
  Administered 2022-10-06: 500 mg via ORAL
  Filled 2022-10-06: qty 1

## 2022-10-06 NOTE — ED Triage Notes (Signed)
Pt c/o left lower sided dental pain x 1 week

## 2022-10-06 NOTE — ED Provider Notes (Signed)
[facility]-EMERGENCY DEPT [facility] Emergency Department Provider Note MRN:  [id number]  Arrival date & time: 10/06/22     Chief Complaint   Dental Pain   History of Present Illness   Henry Colon is a 26 y.o. year-old male presents to the ED with chief complaint of left lower rear molar pain.  States that he has been having the pain for about 1 week.  Denies fevers or chills.  He states that he does not have a dentist.  Denies any successful treatments prior to arrival.  History provided by patient.   Review of Systems  Pertinent positive and negative review of systems noted in HPI.    Physical Exam   Vitals:   10/06/22 0033  BP: (!) 158/107  Pulse: 71  Resp: 18  Temp: 98.3 F (36.8 C)  SpO2: 99%    CONSTITUTIONAL:  well-appearing, NAD NEURO:  Alert and oriented x 3, CN 3-12 grossly intact EYES:  eyes equal and reactive ENT/NECK:  Supple, no stridor, left lower rear molar with dental caries, no obvious abscess CARDIO:  appears well-perfused  PULM:  No respiratory distress,  GI/GU:  non-distended,  MSK/SPINE:  No gross deformities, no edema, moves all extremities  SKIN:  no rash, atraumatic   *Additional and/or pertinent findings included in MDM below  Diagnostic and Interventional Summary    EKG Interpretation  Date/Time:    Ventricular Rate:    PR Interval:    QRS Duration:   QT Interval:    QTC Calculation:   R Axis:     Text Interpretation:         Labs Reviewed - No data to display  No orders to display    Medications  penicillin v potassium (VEETID) tablet 500 mg (500 mg Oral Given 10/06/22 0213)  ketorolac (TORADOL) 30 MG/ML injection 30 mg (30 mg Intramuscular Given 10/06/22 1761)     Procedures  /  Critical Care Procedures  ED Course and Medical Decision Making  I have reviewed the triage vital signs, the nursing notes, and pertinent available records from the EMR.  Social Determinants Affecting Complexity of Care: Patient has  no clinically significant social determinants affecting this chief complaint..   ED Course:    Medical Decision Making Patient with dentalgia.  No abscess requiring immediate incision and drainage.  Exam not concerning for Ludwig's angina or pharyngeal abscess.  Will treat with penicillin. Pt instructed to follow-up with dentist.  Discussed return precautions. Pt safe for discharge.   Risk Prescription drug management.     Consultants: No consultations were needed in caring for this patient.   Treatment and Plan: Emergency department workup does not suggest an emergent condition requiring admission or immediate intervention beyond  what has been performed at this time. The patient is safe for discharge and has  been instructed to return immediately for worsening symptoms, change in  symptoms or any other concerns    Final Clinical Impressions(s) / ED Diagnoses     ICD-10-CM   1. Pain, dental  K08.89       ED Discharge Orders          Ordered    penicillin v potassium (VEETID) 500 MG tablet  4 times daily        10/06/22 0229              Discharge Instructions Discussed with and Provided to Patient:   Discharge Instructions   None      Roxy Horseman, PA-C  10/06/22 0230    Merryl Hacker, MD 10/06/22 [phone]

## 2022-11-14 ENCOUNTER — Ambulatory Visit
Admission: EM | Admit: 2022-11-14 | Discharge: 2022-11-14 | Disposition: A | Discharge status: Home / Self Care | Payer: Self-pay | Attending: Physician Assistant | Admitting: Physician Assistant

## 2022-11-14 DIAGNOSIS — K0889 Other specified disorders of teeth and supporting structures: Secondary | ICD-10-CM

## 2022-11-14 MED ORDER — AMOXICILLIN 500 MG PO CAPS: 500.0000 mg | ORAL_CAPSULE | Freq: Three times a day (TID) | ORAL | 0 refills | Status: DC

## 2022-11-14 MED ORDER — IBUPROFEN 600 MG PO TABS: 600.0000 mg | ORAL_TABLET | Freq: Four times a day (QID) | ORAL | 0 refills | Status: DC | PRN

## 2022-11-14 MED ORDER — IBUPROFEN 600 MG PO TABS
600.0000 mg | ORAL_TABLET | Freq: Four times a day (QID) | ORAL | 0 refills | Status: AC | PRN
Start: 2022-11-14 — End: ?

## 2022-11-14 NOTE — ED Provider Notes (Signed)
[facility]    CSN: [id number] Arrival date & time: 11/14/22  1113      History   Chief Complaint Chief Complaint  Patient presents with   Dental Pain    HPI Henry Colon is a 26 y.o. male.   Patient here today for evaluation of dental pain that he thinks is associated with his wisdom teeth.  He states that he does not currently have a dentist.  He has not had any fever.  He has not had any nausea or vomiting.  He has tried taking over-the-counter pain medication without significant relief.  The history is provided by the patient.  Dental Pain Associated symptoms: no fever     Past Medical History:  Diagnosis Date   Asthma    Diabetes mellitus without complication (HCC)    Premature baby     There are no problems to display for this patient.   Past Surgical History:  Procedure Laterality Date   ABDOMINAL SURGERY     CENTRAL VENOUS CATHETER INSERTION         Home Medications    Prior to Admission medications   Medication Sig Start Date End Date Taking? Authorizing Provider  amoxicillin (AMOXIL) 500 MG capsule Take 1 capsule (500 mg total) by mouth 3 (three) times daily. 11/14/22  Yes Tomi Bamberger, PA-C  albuterol (VENTOLIN HFA) 108 (90 Base) MCG/ACT inhaler Inhale 1-2 puffs into the lungs every 6 (six) hours as needed for wheezing or shortness of breath. 01/15/22   Achille Rich, PA-C  ibuprofen (ADVIL) 600 MG tablet Take 1 tablet (600 mg total) by mouth every 6 (six) hours as needed for mild pain or moderate pain. 11/14/22   Tomi Bamberger, PA-C  metFORMIN (GLUCOPHAGE) 500 MG tablet Take 1 tablet (500 mg total) by mouth 2 (two) times daily with a meal. 01/15/22   Achille Rich, PA-C  Multiple Vitamin (MULTIVITAMIN WITH MINERALS) TABS tablet Take 1 tablet by mouth daily.    [provider]    Family History History reviewed. No pertinent family history.  Social History Social History   Tobacco Use   Smoking status: Never    Smokeless tobacco: Never  Vaping Use   Vaping Use: Never used  Substance Use Topics   Alcohol use: No   Drug use: No     Allergies   Patient has no known allergies.   Review of Systems Review of Systems  Constitutional:  Negative for chills and fever.  HENT:  Positive for dental problem.   Eyes:  Negative for discharge and redness.  Respiratory:  Negative for shortness of breath.   Gastrointestinal:  Negative for nausea and vomiting.  Skin:  Positive for color change and wound.  Neurological:  Negative for numbness.     Physical Exam Triage Vital Signs ED Triage Vitals [11/14/22 1257]  Enc Vitals Group     BP (!) 145/79     Pulse Rate 80     Resp 16     Temp 98.2 F (36.8 C)     Temp Source Oral     SpO2 98 %     Weight      Height      Head Circumference      Peak Flow      Pain Score 3     Pain Loc      Pain Edu?      Excl. in [place]?    No data found.  Updated Vital Signs  BP (!) 145/79 (BP Location: Right Arm)   Pulse 80   Temp 98.2 F (36.8 C) (Oral)   Resp 16   SpO2 98%       Physical Exam Vitals and nursing note reviewed.  Constitutional:      General: He is not in acute distress.    Appearance: Normal appearance. He is not ill-appearing.  HENT:     Head: Normocephalic and atraumatic.     Mouth/Throat:     Comments: Diffuse swelling to lower back gums bilaterally with partial eruption of suspected wisdom teeth. Multiple caries noted Eyes:     Conjunctiva/sclera: Conjunctivae normal.  Cardiovascular:     Rate and Rhythm: Normal rate.  Pulmonary:     Effort: Pulmonary effort is normal.  Neurological:     Mental Status: He is alert.  Psychiatric:        Mood and Affect: Mood normal.        Behavior: Behavior normal.        Thought Content: Thought content normal.      UC Treatments / Results  Labs (all labs ordered are listed, but only abnormal results are displayed) Labs Reviewed - No data to display  EKG   Radiology No  results found.  Procedures Procedures (including critical care time)  Medications Ordered in UC Medications - No data to display  Initial Impression / Assessment and Plan / UC Course  I have reviewed the triage vital signs and the nursing notes.  Pertinent labs & imaging results that were available during my care of the patient were reviewed by me and considered in my medical decision making (see chart for details).    Will trial amoxicillin and ibuprofen to cover infection and inflammation/ pain. Recommended follow up with dentistry as soon as possible. Contact information provided for lower cost options for local dentistry.   Final Clinical Impressions(s) / UC Diagnoses   Final diagnoses:  Pain, dental     Discharge Instructions       Please follow up with dentistry as soon as possible.      ED Prescriptions     Medication Sig Dispense Auth. Provider   amoxicillin (AMOXIL) 500 MG capsule Take 1 capsule (500 mg total) by mouth 3 (three) times daily. 21 capsule Erma Pinto F, PA-C   ibuprofen (ADVIL) 600 MG tablet  (Status: Discontinued) Take 1 tablet (600 mg total) by mouth every 6 (six) hours as needed for mild pain or moderate pain. 30 tablet Erma Pinto F, PA-C   ibuprofen (ADVIL) 600 MG tablet Take 1 tablet (600 mg total) by mouth every 6 (six) hours as needed for mild pain or moderate pain. 30 tablet Tomi Bamberger, PA-C      PDMP not reviewed this encounter.   Tomi Bamberger, PA-C 11/14/22 1330

## 2022-11-14 NOTE — ED Triage Notes (Signed)
Pt c/o left/right bottom teeth pain for a few weeks thinks it is his wisdom teeth causing the pain.

## 2022-11-14 NOTE — Discharge Instructions (Signed)
  Please follow up with dentistry as soon as possible.

## 2023-01-10 ENCOUNTER — Other Ambulatory Visit: Payer: Self-pay

## 2023-01-10 ENCOUNTER — Ambulatory Visit
Admission: EM | Admit: 2023-01-10 | Discharge: 2023-01-10 | Disposition: A | Discharge status: Home / Self Care | Payer: Self-pay | Attending: Physician Assistant | Admitting: Physician Assistant

## 2023-01-10 ENCOUNTER — Encounter: Payer: Self-pay | Admitting: Emergency Medicine

## 2023-01-10 DIAGNOSIS — K047 Periapical abscess without sinus: Secondary | ICD-10-CM

## 2023-01-10 MED ORDER — AMOXICILLIN 500 MG PO CAPS: 500.0000 mg | ORAL_CAPSULE | Freq: Three times a day (TID) | ORAL | 0 refills | Status: DC

## 2023-01-10 NOTE — Discharge Instructions (Signed)
  Please follow up with dentist as soon as possible.  

## 2023-01-10 NOTE — ED Triage Notes (Signed)
Pt here for left bottom dental pain x 5 days

## 2023-01-10 NOTE — ED Provider Notes (Signed)
[facility]    CSN: [id number] Arrival date & time: 01/10/23  1733      History   Chief Complaint Chief Complaint  Patient presents with   Dental Pain    HPI Henry Colon is a 27 y.o. male.   Patient here today for evaluation of toothache. Pain is present to left lower molars. He does not currently have a dentist. Symptoms started about a week ago. He has not had fever. He denies any vomiting or nausea. He has tried OTC meds without resolution.   The history is provided by the patient.    Past Medical History:  Diagnosis Date   Asthma    Diabetes mellitus without complication ([facility])    Premature baby     There are no problems to display for this patient.   Past Surgical History:  Procedure Laterality Date   ABDOMINAL SURGERY     CENTRAL VENOUS CATHETER INSERTION         Home Medications    Prior to Admission medications   Medication Sig Start Date End Date Taking? Authorizing Provider  amoxicillin (AMOXIL) 500 MG capsule Take 1 capsule (500 mg total) by mouth 3 (three) times daily. 01/10/23  Yes Francene Finders, PA-C  albuterol (VENTOLIN HFA) 108 (90 Base) MCG/ACT inhaler Inhale 1-2 puffs into the lungs every 6 (six) hours as needed for wheezing or shortness of breath. 01/15/22   Sherrell Puller, PA-C  ibuprofen (ADVIL) 600 MG tablet Take 1 tablet (600 mg total) by mouth every 6 (six) hours as needed for mild pain or moderate pain. 11/14/22   Francene Finders, PA-C  metFORMIN (GLUCOPHAGE) 500 MG tablet Take 1 tablet (500 mg total) by mouth 2 (two) times daily with a meal. 01/15/22   Sherrell Puller, PA-C  Multiple Vitamin (MULTIVITAMIN WITH MINERALS) TABS tablet Take 1 tablet by mouth daily.    [provider]    Family History History reviewed. No pertinent family history.  Social History Social History   Tobacco Use   Smoking status: Never   Smokeless tobacco: Never  Vaping Use   Vaping Use: Never used  Substance Use Topics    Alcohol use: No   Drug use: No     Allergies   Patient has no known allergies.   Review of Systems Review of Systems  Constitutional:  Negative for chills and fever.  HENT:  Positive for dental problem and facial swelling.   Eyes:  Negative for discharge and redness.  Neurological:  Negative for numbness.     Physical Exam Triage Vital Signs ED Triage Vitals  Enc Vitals Group     BP      Pulse      Resp      Temp      Temp src      SpO2      Weight      Height      Head Circumference      Peak Flow      Pain Score      Pain Loc      Pain Edu?      Excl. in [place]?    No data found.  Updated Vital Signs BP 131/87 (BP Location: Left Arm)   Pulse 74   Temp 98.3 F (36.8 C) (Oral)   Resp 18   SpO2 100%   Physical Exam Vitals and nursing note reviewed.  Constitutional:      General: He is not in acute  distress.    Appearance: Normal appearance. He is not ill-appearing.  HENT:     Head: Normocephalic and atraumatic.     Mouth/Throat:     Comments: Multiple caries noted to left lower molars, diffuse gum swelling and erythema around same. Mild swelling to left lower mandible Eyes:     Conjunctiva/sclera: Conjunctivae normal.  Cardiovascular:     Rate and Rhythm: Normal rate.  Pulmonary:     Effort: Pulmonary effort is normal.  Neurological:     Mental Status: He is alert.  Psychiatric:        Mood and Affect: Mood normal.        Behavior: Behavior normal.        Thought Content: Thought content normal.      UC Treatments / Results  Labs (all labs ordered are listed, but only abnormal results are displayed) Labs Reviewed - No data to display  EKG   Radiology No results found.  Procedures Procedures (including critical care time)  Medications Ordered in UC Medications - No data to display  Initial Impression / Assessment and Plan / UC Course  I have reviewed the triage vital signs and the nursing notes.  Pertinent labs & imaging results  that were available during my care of the patient were reviewed by me and considered in my medical decision making (see chart for details).    Antibiotic prescribed to cover suspected abscess. Recommended ibuprofen and tylenol for pain relief and further evaluation if no improvement or with any further concerns. Recommend follow up with dentistry as soon as possible.   Final Clinical Impressions(s) / UC Diagnoses   Final diagnoses:  Dental abscess     Discharge Instructions       Please follow up with dentist as soon as possible .     ED Prescriptions     Medication Sig Dispense Auth. Provider   amoxicillin (AMOXIL) 500 MG capsule Take 1 capsule (500 mg total) by mouth 3 (three) times daily. 21 capsule Francene Finders, PA-C      I have reviewed the PDMP during this encounter.   Francene Finders, PA-C 01/10/23 1943

## 2023-07-15 IMAGING — CR DG CHEST 2V
2 series · 2 of 2 positions shown · non-contrast
Comparison: Chest radiographs 03/31/2018 and earlier.

CLINICAL DATA: 25-year-old male with shortness of breath, central
chest pain, cough and chills.

EXAM:
CHEST - 2 VIEW

[w chest pa]
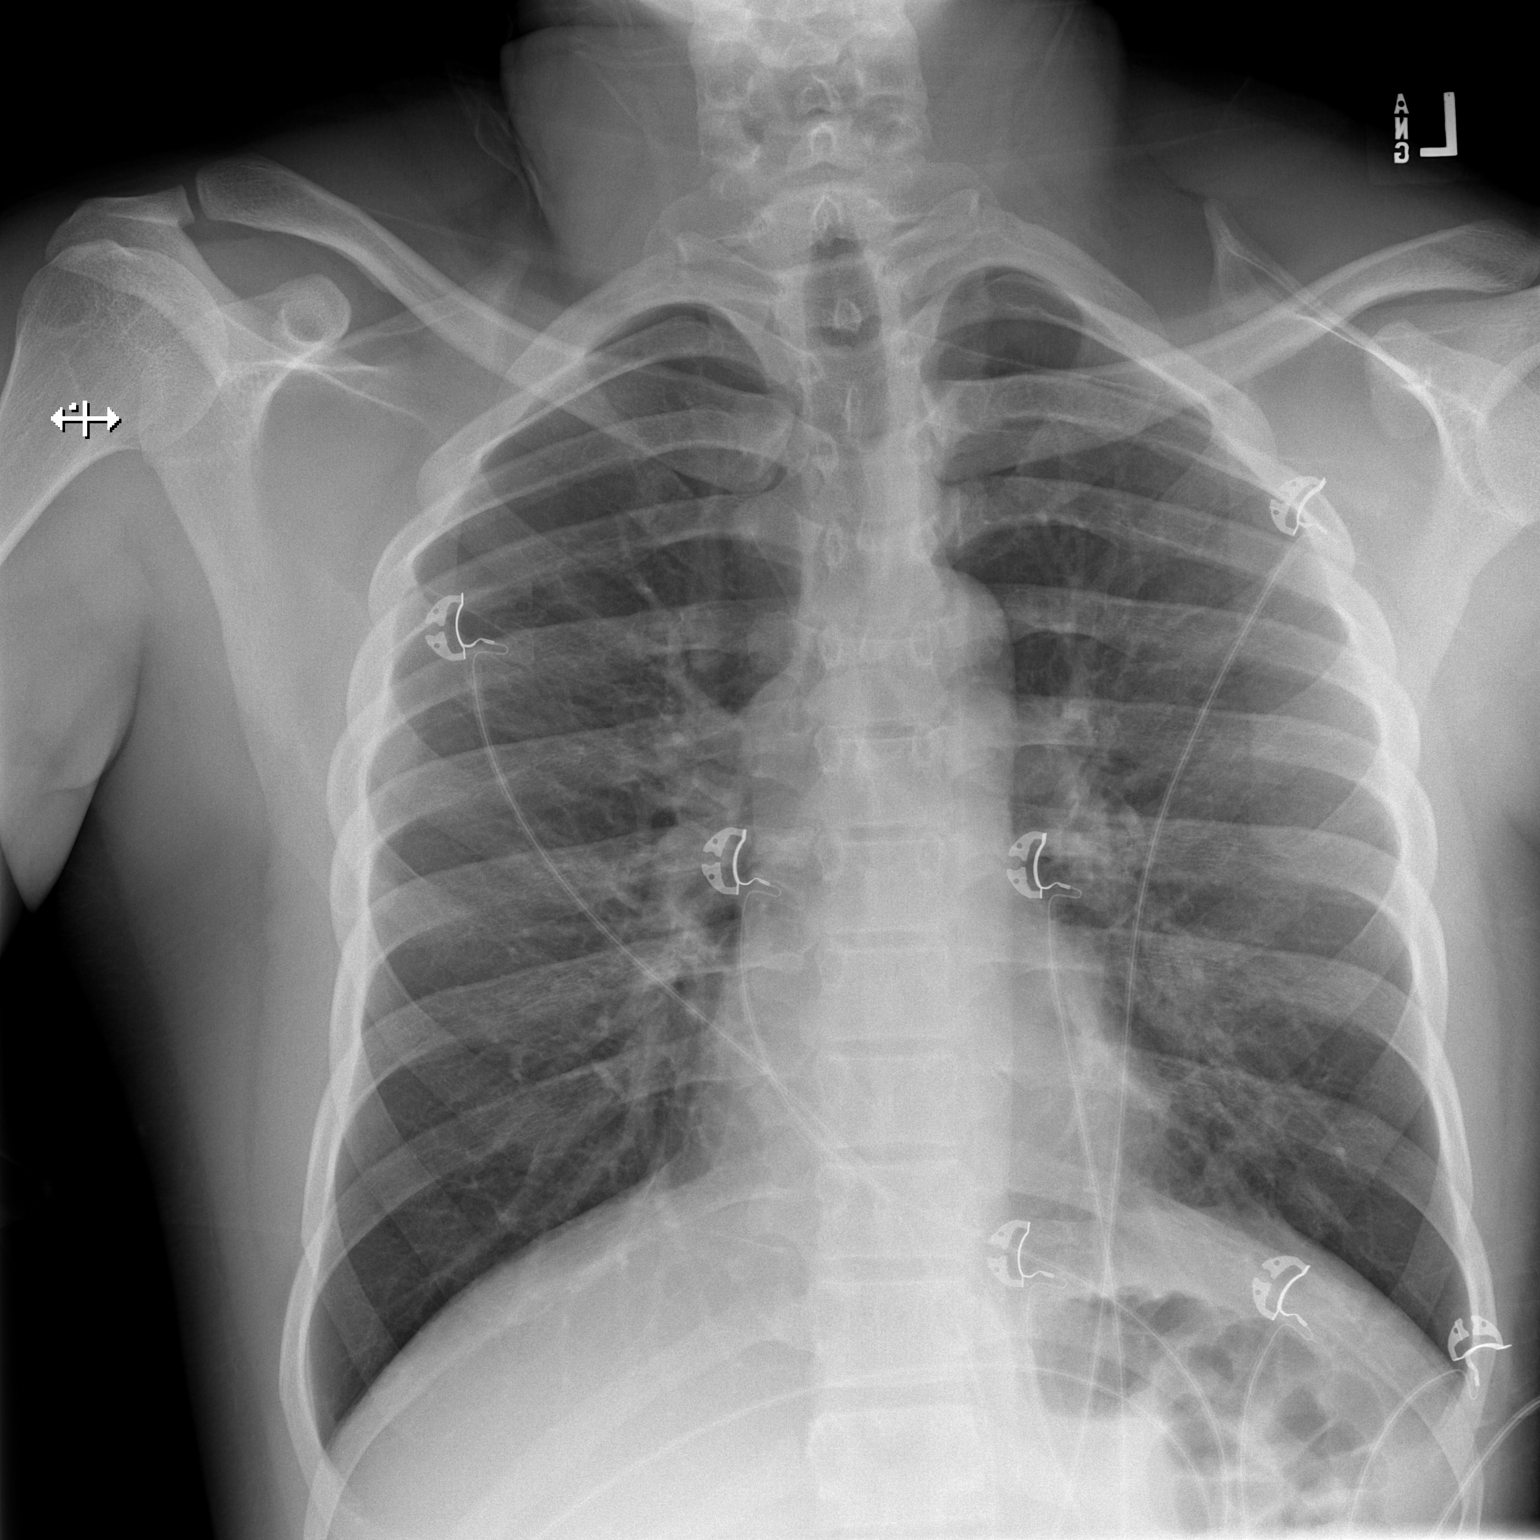

[w chest lat]
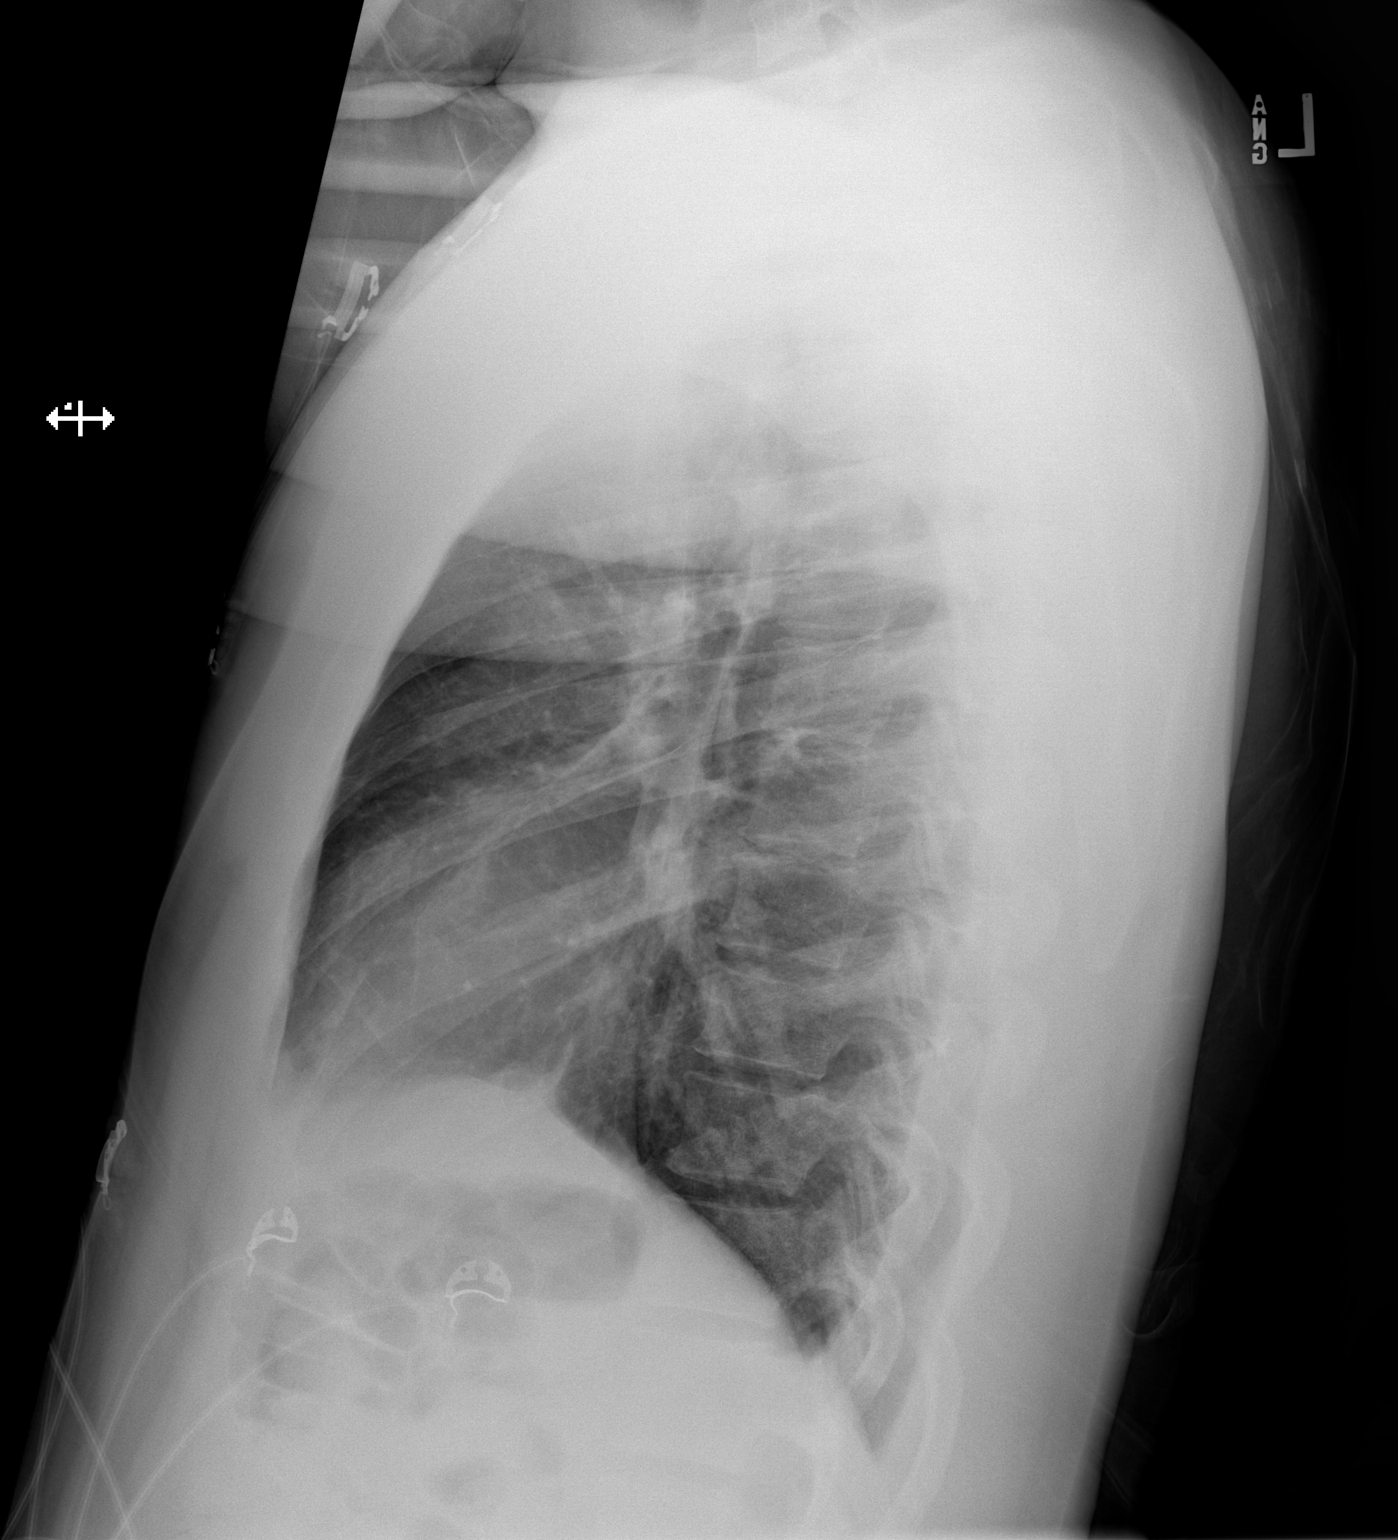

[2 of 2 positions shown; findings below may reference images not displayed]

FINDINGS: Lung volumes and mediastinal contours remain normal. Visualized
tracheal air column is within normal limits. No pneumothorax,
pleural effusion or consolidation. But there is streaky bilateral
perihilar and interstitial opacity when compared to 7111, including
at the anterior basilar lower lobe segments.

No acute osseous abnormality identified. Negative visible bowel gas.
IMPRESSION: Streaky bilateral perihilar and interstitial opacity, nonspecific
but suspicious for bilateral acute viral/atypical respiratory
infection. No pleural effusion.

## 2024-10-15 ENCOUNTER — Encounter (HOSPITAL_COMMUNITY): Payer: Self-pay

## 2024-10-15 ENCOUNTER — Ambulatory Visit (HOSPITAL_COMMUNITY)
Admission: EM | Admit: 2024-10-15 | Discharge: 2024-10-15 | Disposition: A | Discharge status: Home / Self Care | Payer: Self-pay | Attending: Emergency Medicine | Admitting: Emergency Medicine

## 2024-10-15 DIAGNOSIS — J069 Acute upper respiratory infection, unspecified: Secondary | ICD-10-CM

## 2024-10-15 LAB — POC COVID19/FLU A&B COMBO
Covid Antigen, POC: NEGATIVE
Influenza A Antigen, POC: NEGATIVE
Influenza B Antigen, POC: NEGATIVE

## 2024-10-15 MED ORDER — BENZONATATE 100 MG PO CAPS: 100.0000 mg | ORAL_CAPSULE | Freq: Three times a day (TID) | ORAL | 0 refills | Status: DC

## 2024-10-15 NOTE — ED Triage Notes (Signed)
 Pt c/o cough, congestion, fever, and fatigue x2 days. States took severe cold/flu meds with no relief.

## 2024-10-15 NOTE — ED Provider Notes (Signed)
 [facility]    CSN: [id number] Arrival date & time: 10/15/24  1156      History   Chief Complaint Chief Complaint  Patient presents with   Cough    HPI Henry Colon is a 28 y.o. male.   Patient presents with cough, congestion, fever, fatigue, body aches, and chills that began about 2 days ago.  Denies shortness of breath, chest pain, nausea, vomiting, diarrhea, abdominal pain.  Patient denies any known sick exposures.  Patient states that he has been taking over-the-counter cold and flu medications without relief.  The history is provided by the patient and medical records.  Cough   Past Medical History:  Diagnosis Date   Asthma    Diabetes mellitus without complication (HCC)    Premature baby     There are no active problems to display for this patient.   Past Surgical History:  Procedure Laterality Date   ABDOMINAL SURGERY     CENTRAL VENOUS CATHETER INSERTION         Home Medications    Prior to Admission medications   Medication Sig Start Date End Date Taking? Authorizing Provider  benzonatate (TESSALON) 100 MG capsule Take 1 capsule (100 mg total) by mouth every 8 (eight) hours. 10/15/24  Yes Johnie Flaming A, NP  albuterol  (VENTOLIN  HFA) 108 (90 Base) MCG/ACT inhaler Inhale 1-2 puffs into the lungs every 6 (six) hours as needed for wheezing or shortness of breath. 01/15/22   Bernis Ernst, PA-C  ibuprofen  (ADVIL ) 600 MG tablet Take 1 tablet (600 mg total) by mouth every 6 (six) hours as needed for mild pain or moderate pain. 11/14/22   Billy Asberry FALCON, PA-C  metFORMIN  (GLUCOPHAGE ) 500 MG tablet Take 1 tablet (500 mg total) by mouth 2 (two) times daily with a meal. 01/15/22   Bernis Ernst, PA-C  Multiple Vitamin (MULTIVITAMIN WITH MINERALS) TABS tablet Take 1 tablet by mouth daily.    [provider]    Family History History reviewed. No pertinent family history.  Social History Social History   Tobacco Use   Smoking  status: Never   Smokeless tobacco: Never  Vaping Use   Vaping status: Never Used  Substance Use Topics   Alcohol use: No   Drug use: No     Allergies   Patient has no known allergies.   Review of Systems Review of Systems  Respiratory:  Positive for cough.    Per HPI  Physical Exam Triage Vital Signs ED Triage Vitals  Encounter Vitals Group     BP 10/15/24 1232 129/82     Girls Systolic BP Percentile --      Girls Diastolic BP Percentile --      Boys Systolic BP Percentile --      Boys Diastolic BP Percentile --      Pulse Rate 10/15/24 1232 (!) 104     Resp 10/15/24 1232 18     Temp 10/15/24 1232 99.4 F (37.4 C)     Temp Source 10/15/24 1232 Oral     SpO2 10/15/24 1232 93 %     Weight --      Height --      Head Circumference --      Peak Flow --      Pain Score 10/15/24 1231 0     Pain Loc --      Pain Education --      Exclude from Growth Chart --    No data found.  Updated Vital Signs BP 129/82 (BP Location: Right Arm)   Pulse (!) 104   Temp 99.4 F (37.4 C) (Oral)   Resp 18   SpO2 93%   Visual Acuity Right Eye Distance:   Left Eye Distance:   Bilateral Distance:    Right Eye Near:   Left Eye Near:    Bilateral Near:     Physical Exam Vitals and nursing note reviewed.  Constitutional:      General: He is awake. He is not in acute distress.    Appearance: Normal appearance. He is well-developed and well-groomed. He is not ill-appearing.  HENT:     Right Ear: Tympanic membrane, ear canal and external ear normal.     Left Ear: Tympanic membrane, ear canal and external ear normal.     Nose: Congestion and rhinorrhea present.     Mouth/Throat:     Mouth: Mucous membranes are moist.     Pharynx: Posterior oropharyngeal erythema and postnasal drip present. No oropharyngeal exudate.  Cardiovascular:     Rate and Rhythm: Normal rate and regular rhythm.  Pulmonary:     Effort: Pulmonary effort is normal.     Breath sounds: Normal breath  sounds.  Skin:    General: Skin is warm and dry.  Neurological:     Mental Status: He is alert.  Psychiatric:        Behavior: Behavior is cooperative.      UC Treatments / Results  Labs (all labs ordered are listed, but only abnormal results are displayed) Labs Reviewed  POC COVID19/FLU A&B COMBO    EKG   Radiology No results found.  Procedures Procedures (including critical care time)  Medications Ordered in UC Medications - No data to display  Initial Impression / Assessment and Plan / UC Course  I have reviewed the triage vital signs and the nursing notes.  Pertinent labs & imaging results that were available during my care of the patient were reviewed by me and considered in my medical decision making (see chart for details).     Patient is overall well-appearing.  Vitals are stable.  Additional rhinorrhea present, mild erythema and PND noted posterior oropharynx.  Lungs clear bilaterally on auscultation.  COVID and flu testing negative.  Symptoms likely viral in nature.  Prescribed Tessalon for cough.  Discussed over-the-counter medications as needed for symptoms.  Discussed follow-up and return precautions. Final Clinical Impressions(s) / UC Diagnoses   Final diagnoses:  Viral URI with cough     Discharge Instructions      Your COVID and flu testing were both negative today.  I believe your symptoms are likely related to a viral respiratory illness. You can take Tessalon every 8 hours as needed for cough. I recommend Mucinex  which she can purchase over-the-counter that you can take for cough and congestion. Otherwise alternate between Tylenol  and ibuprofen  as needed for any pain or fever. Make sure you are staying hydrated and getting plenty of rest. Follow-up with your primary care provider or return here as needed.     ED Prescriptions     Medication Sig Dispense Auth. Provider   benzonatate (TESSALON) 100 MG capsule Take 1 capsule (100 mg  total) by mouth every 8 (eight) hours. 21 capsule Johnie Flaming A, NP      PDMP not reviewed this encounter.   Johnie Flaming A, NP 10/15/24 1309

## 2024-10-15 NOTE — Discharge Instructions (Signed)
 Your COVID and flu testing were both negative today.  I believe your symptoms are likely related to a viral respiratory illness. You can take Tessalon every 8 hours as needed for cough. I recommend Mucinex  which she can purchase over-the-counter that you can take for cough and congestion. Otherwise alternate between Tylenol  and ibuprofen  as needed for any pain or fever. Make sure you are staying hydrated and getting plenty of rest. Follow-up with your primary care provider or return here as needed.

## 2024-11-27 ENCOUNTER — Other Ambulatory Visit: Payer: Self-pay

## 2024-11-27 ENCOUNTER — Emergency Department (HOSPITAL_COMMUNITY)

## 2024-11-27 ENCOUNTER — Emergency Department (HOSPITAL_COMMUNITY): Admission: EM | Admit: 2024-11-27 | Discharge: 2024-11-27 | Disposition: A | Discharge status: Home / Self Care

## 2024-11-27 ENCOUNTER — Encounter (HOSPITAL_COMMUNITY): Payer: Self-pay

## 2024-11-27 DIAGNOSIS — J111 Influenza due to unidentified influenza virus with other respiratory manifestations: Secondary | ICD-10-CM | POA: Insufficient documentation

## 2024-11-27 DIAGNOSIS — R509 Fever, unspecified: Secondary | ICD-10-CM | POA: Diagnosis present

## 2024-11-27 LAB — RESP PANEL BY RT-PCR (RSV, FLU A&B, COVID)  RVPGX2
Influenza A by PCR: NEGATIVE
Influenza B by PCR: POSITIVE — AB
Resp Syncytial Virus by PCR: NEGATIVE
SARS Coronavirus 2 by RT PCR: NEGATIVE

## 2024-11-27 LAB — GROUP A STREP BY PCR: Group A Strep by PCR: NOT DETECTED

## 2024-11-27 MED ORDER — BENZONATATE 100 MG PO CAPS: 100.0000 mg | ORAL_CAPSULE | Freq: Three times a day (TID) | ORAL | 0 refills | Status: AC

## 2024-11-27 MED ORDER — ALBUTEROL SULFATE HFA 108 (90 BASE) MCG/ACT IN AERS: 1.0000 | INHALATION_SPRAY | Freq: Four times a day (QID) | RESPIRATORY_TRACT | 1 refills | Status: AC | PRN

## 2024-11-27 MED ORDER — ACETAMINOPHEN 325 MG PO TABS
650.0000 mg | ORAL_TABLET | Freq: Once | ORAL | Status: AC
  Administered 2024-11-27: 650 mg via ORAL
  Filled 2024-11-27: qty 2

## 2024-11-27 NOTE — Discharge Instructions (Addendum)
 You were seen in the ER today for evlaution of your symptoms. You tested positive for the flu.  You can rotate between Tylenol  and Motrin  to help with your fevers.  Please be careful to not take too much of 1 medication if you are taking a cough cold medication as it may have these active gradients already in this.  Please consult your pharmacist.  I have prescribed you 2 medications.  Albuterol  inhaler to take as needed for your cough.  I have also prescribed you some Tessalon  Perles to help with your cough as well.  Please make sure that you are staying at home and resting.  If you have any concerns, new or worsening symptoms, please return to your nearest emergency department for reevaluation.  **YOU NEED TO FOLLOW UP WITH A PCP FOR YOUR HEALTH  Contact a health care provider if: You get new symptoms. You have chest pain. You have watery poop, also called diarrhea. You have a fever. Your cough gets worse. You start to have more mucus. You feel like you may vomit, or you vomit. Get help right away if: You become short of breath or have trouble breathing. Your skin or nails turn blue. You have very bad pain or stiffness in your neck. You get a sudden headache or pain in your face or ear. You vomit each time you eat or drink. These symptoms may be an emergency. Call 911 right away. Do not wait to see if the symptoms will go away. Do not drive yourself to the hospital.

## 2024-11-27 NOTE — ED Provider Notes (Signed)
  EMERGENCY DEPARTMENT AT [facility] Provider Note   CSN: [id number] Arrival date & time: 11/27/24  8090     Patient presents with: Fever   Henry Colon is a 28 y.o. male.  {Add pertinent medical, surgical, social history, OB history to HPI:32947}  Fever      Prior to Admission medications   Medication Sig Start Date End Date Taking? Authorizing Provider  albuterol  (VENTOLIN  HFA) 108 (90 Base) MCG/ACT inhaler Inhale 1-2 puffs into the lungs every 6 (six) hours as needed for wheezing or shortness of breath. 01/15/22   Bernis Ernst, PA-C  benzonatate  (TESSALON ) 100 MG capsule Take 1 capsule (100 mg total) by mouth every 8 (eight) hours. 10/15/24   Johnie Flaming A, NP  ibuprofen  (ADVIL ) 600 MG tablet Take 1 tablet (600 mg total) by mouth every 6 (six) hours as needed for mild pain or moderate pain. 11/14/22   Billy Asberry FALCON, PA-C  metFORMIN  (GLUCOPHAGE ) 500 MG tablet Take 1 tablet (500 mg total) by mouth 2 (two) times daily with a meal. 01/15/22   Bernis Ernst, PA-C  Multiple Vitamin (MULTIVITAMIN WITH MINERALS) TABS tablet Take 1 tablet by mouth daily.    [provider]    Allergies: Patient has no known allergies.    Review of Systems  Constitutional:  Positive for fever.    Updated Vital Signs BP (!) 140/90 (BP Location: Left Arm)   Pulse 96   Temp (!) 100.7 F (38.2 C) (Oral)   Resp 14   SpO2 95%   Physical Exam  (all labs ordered are listed, but only abnormal results are displayed) Labs Reviewed  RESP PANEL BY RT-PCR (RSV, FLU A&B, COVID)  [id number] - Abnormal; Notable for the following components:      Result Value   Influenza B by PCR POSITIVE (*)    All other components within normal limits  GROUP A STREP BY PCR    EKG: None  Radiology: DG Chest 2 View Result Date: 11/27/2024 EXAM: 2 VIEW(S) XRAY OF THE CHEST 11/27/2024 08:37:00 PM COMPARISON: 1 / 21 / 23 CLINICAL HISTORY: cough FINDINGS: LUNGS AND PLEURA: No focal  pulmonary opacity. No pleural effusion. No pneumothorax. HEART AND MEDIASTINUM: No acute abnormality of the cardiac and mediastinal silhouettes. BONES AND SOFT TISSUES: No acute osseous abnormality. IMPRESSION: 1. No acute cardiopulmonary process. Electronically signed by: Norman Gatlin MD 11/27/2024 09:21 PM EST RP Workstation: [id number]    {Document cardiac monitor, telemetry assessment procedure when appropriate:32947} Procedures   Medications Ordered in the ED  acetaminophen  (TYLENOL ) tablet 650 mg (650 mg Oral Given 11/27/24 1928)      {Click here for ABCD2, HEART and other calculators REFRESH Note before signing:1}                              Medical Decision Making Amount and/or Complexity of Data Reviewed Radiology: ordered.  Risk Prescription drug management.   ***  {Document critical care time when appropriate  Document review of labs and clinical decision tools ie [id number], etc  Document your independent review of radiology images and any outside records  Document your discussion with family members, caretakers and with consultants  Document social determinants of health affecting pt's care  Document your decision making why or why not admission, treatments were needed:32947:::1}   Final diagnoses:  None    ED Discharge Orders     None

## 2024-11-27 NOTE — ED Triage Notes (Signed)
 Pt reports with fever, cough, sore throat, and congestion for 5 days. Pt reports being around sick family members.
